# Patient Record
Sex: Male | Born: 1995 | Race: White | Hispanic: No | Marital: Single | Smoking: Current some day smoker
Health system: Southern US, Community
[De-identification: ages and names within clinical notes are randomized; demographics above are authoritative.]

## PROBLEM LIST (undated history)

## (undated) DIAGNOSIS — R011 Cardiac murmur, unspecified: Secondary | ICD-10-CM

## (undated) HISTORY — PX: HERNIA REPAIR: SHX51

---
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
## 2022-07-20 ENCOUNTER — Emergency Department (HOSPITAL_COMMUNITY): Payer: Self-pay

## 2022-07-20 ENCOUNTER — Emergency Department (HOSPITAL_COMMUNITY)
Admission: EM | Admit: 2022-07-20 | Discharge: 2022-07-21 | Discharge status: Against Medical Advice | Payer: Self-pay | Attending: Medical | Admitting: Medical

## 2022-07-20 ENCOUNTER — Encounter (HOSPITAL_COMMUNITY): Payer: Self-pay

## 2022-07-20 ENCOUNTER — Other Ambulatory Visit: Payer: Self-pay

## 2022-07-20 DIAGNOSIS — Z5321 Procedure and treatment not carried out due to patient leaving prior to being seen by health care provider: Secondary | ICD-10-CM | POA: Insufficient documentation

## 2022-07-20 DIAGNOSIS — J101 Influenza due to other identified influenza virus with other respiratory manifestations: Secondary | ICD-10-CM | POA: Insufficient documentation

## 2022-07-20 DIAGNOSIS — Z1152 Encounter for screening for COVID-19: Secondary | ICD-10-CM | POA: Insufficient documentation

## 2022-07-20 DIAGNOSIS — R519 Headache, unspecified: Secondary | ICD-10-CM | POA: Insufficient documentation

## 2022-07-20 DIAGNOSIS — R059 Cough, unspecified: Secondary | ICD-10-CM | POA: Insufficient documentation

## 2022-07-20 DIAGNOSIS — R0602 Shortness of breath: Secondary | ICD-10-CM | POA: Insufficient documentation

## 2022-07-20 DIAGNOSIS — R0981 Nasal congestion: Secondary | ICD-10-CM | POA: Insufficient documentation

## 2022-07-20 LAB — RESP PANEL BY RT-PCR (RSV, FLU A&B, COVID)  RVPGX2
Influenza A by PCR: POSITIVE — AB
Influenza B by PCR: NEGATIVE
Resp Syncytial Virus by PCR: NEGATIVE
SARS Coronavirus 2 by RT PCR: NEGATIVE

## 2022-07-20 MED ORDER — ACETAMINOPHEN 325 MG PO TABS
650.0000 mg | ORAL_TABLET | Freq: Once | ORAL | Status: AC
  Administered 2022-07-20: 650 mg via ORAL
  Filled 2022-07-20: qty 2

## 2022-07-20 NOTE — ED Triage Notes (Signed)
Pt arrived complaining of shortness of breath, cough, congestion and headache.  Pt state that he has been taking OTC meds with minimal relief

## 2022-07-20 NOTE — ED Provider Triage Note (Signed)
Emergency Medicine Provider Triage Evaluation Note  Tim Perez , a 25 y.o. male  was evaluated in triage.  Pt complains of SOB, cough, fatiguex1 week. Hurts to cough. No N/V/D. Endorses runny nose.  Review of Systems  Positive: Fatigue, chills Negative: fever  Physical Exam  BP (!) 141/98   Pulse (!) 104   Temp 98.3 F (36.8 C)   Resp 20   Ht 5\' 9"  (1.753 m)   Wt 61.2 kg   SpO2 96%   BMI 19.94 kg/m  Gen:   Awake, no distress   Resp:  Normal effort  MSK:   Moves extremities without difficulty  Other:  +coarse breath sounds   Medical Decision Making  Medically screening exam initiated at 8:47 PM.  Appropriate orders placed.  Hamid was informed that the remainder of the evaluation will be completed by another provider, this initial triage assessment does not replace that evaluation, and the importance of remaining in the ED until their evaluation is complete.    Jackie Plum, Pete Pelt 07/20/22 2048

## 2022-07-21 NOTE — ED Notes (Signed)
NA x3 vitals 

## 2022-07-26 ENCOUNTER — Emergency Department (HOSPITAL_COMMUNITY): Payer: Self-pay

## 2022-07-26 ENCOUNTER — Emergency Department (HOSPITAL_COMMUNITY)
Admission: EM | Admit: 2022-07-26 | Discharge: 2022-07-27 | Discharge status: Against Medical Advice | Payer: Self-pay | Attending: Student | Admitting: Student

## 2022-07-26 ENCOUNTER — Other Ambulatory Visit: Payer: Self-pay

## 2022-07-26 DIAGNOSIS — F10129 Alcohol abuse with intoxication, unspecified: Secondary | ICD-10-CM | POA: Insufficient documentation

## 2022-07-26 DIAGNOSIS — Z5321 Procedure and treatment not carried out due to patient leaving prior to being seen by health care provider: Secondary | ICD-10-CM | POA: Insufficient documentation

## 2022-07-26 NOTE — ED Provider Triage Note (Signed)
Emergency Medicine Provider Triage Evaluation Note  Tim Perez , a 27 y.o. male  was evaluated in triage.  Pt complains of " my sister thinks I need help".  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  Denies any other recreational drug use.  Endorses passive SI ongoing but states that he would never harm himself, has never had a plan to harm himself and wants to live for his daughter.  No HI or AVH.Marland Kitchen  Review of Systems  Positive: As above Negative: As above  Physical Exam  BP (!) 142/93 (BP Location: Left Arm)   Pulse (!) 105   Temp 98.8 F (37.1 C) (Oral)   Resp 20   Ht 5\' 9"  (1.753 m)   Wt 61.2 kg   SpO2 98%   BMI 19.94 kg/m  Gen:   Awake, no distress   Resp:  Normal effort  MSK:   Moves extremities without difficulty  Other:  RRR no m/r/g, smells strongly of ETOH  Medical Decision Making  Medically screening exam initiated at 11:29 PM.  Appropriate orders placed.  Tim Perez was informed that the remainder of the evaluation will be completed by another provider, this initial triage assessment does not replace that evaluation, and the importance of remaining in the ED until their evaluation is complete.  This chart was dictated using voice recognition software, Dragon. Despite the best efforts of this provider to proofread and correct errors, errors may still occur which can change documentation meaning.    Emeline Darling, PA-C 07/26/22 2337
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
## 2022-07-27 LAB — COMPREHENSIVE METABOLIC PANEL
ALT: 52 U/L — ABNORMAL HIGH (ref 0–44)
AST: 100 U/L — ABNORMAL HIGH (ref 15–41)
Albumin: 4.4 g/dL (ref 3.5–5.0)
Alkaline Phosphatase: 76 U/L (ref 38–126)
Anion gap: 15 (ref 5–15)
BUN: 6 mg/dL (ref 6–20)
CO2: 26 mmol/L (ref 22–32)
Calcium: 9.1 mg/dL (ref 8.9–10.3)
Chloride: 100 mmol/L (ref 98–111)
Creatinine, Ser: 0.85 mg/dL (ref 0.61–1.24)
GFR, Estimated: >60 mL/min (ref >60)
Glucose, Bld: 107 mg/dL — ABNORMAL HIGH (ref 70–99)
Potassium: 4.3 mmol/L (ref 3.5–5.1)
Sodium: 141 mmol/L (ref 135–145)
Total Bilirubin: 1.1 mg/dL (ref 0.3–1.2)
Total Protein: 7.4 g/dL (ref 6.5–8.1)

## 2022-07-27 LAB — CBC WITH DIFFERENTIAL/PLATELET
Abs Immature Granulocytes: 0.08 10*3/uL — ABNORMAL HIGH (ref 0.00–0.07)
Basophils Absolute: 0.1 10*3/uL (ref 0.0–0.1)
Basophils Relative: 1 %
Eosinophils Absolute: 0.1 10*3/uL (ref 0.0–0.5)
Eosinophils Relative: 1 %
HCT: 51.5 % (ref 39.0–52.0)
Hemoglobin: 17.6 g/dL — ABNORMAL HIGH (ref 13.0–17.0)
Immature Granulocytes: 1 %
Lymphocytes Relative: 39 %
Lymphs Abs: 3.8 10*3/uL (ref 0.7–4.0)
MCH: 34.4 pg — ABNORMAL HIGH (ref 26.0–34.0)
MCHC: 34.2 g/dL (ref 30.0–36.0)
MCV: 100.6 fL — ABNORMAL HIGH (ref 80.0–100.0)
Monocytes Absolute: 0.6 10*3/uL (ref 0.1–1.0)
Monocytes Relative: 6 %
Neutro Abs: 5 10*3/uL (ref 1.7–7.7)
Neutrophils Relative %: 52 %
Platelets: 247 10*3/uL (ref 150–400)
RBC: 5.12 MIL/uL (ref 4.22–5.81)
RDW: 12.8 % (ref 11.5–15.5)
WBC: 9.6 10*3/uL (ref 4.0–10.5)
nRBC: 0 % (ref 0.0–0.2)

## 2022-07-27 LAB — ACETAMINOPHEN LEVEL: Acetaminophen (Tylenol), Serum: <10 ug/mL — ABNORMAL LOW (ref 10–30)

## 2022-07-27 LAB — ETHANOL: Alcohol, Ethyl (B): 379 mg/dL (ref <10)

## 2022-07-27 LAB — TROPONIN I (HIGH SENSITIVITY): Troponin I (High Sensitivity): 6 ng/L (ref <18)

## 2022-07-27 LAB — SALICYLATE LEVEL: Salicylate Lvl: <7 mg/dL — ABNORMAL LOW (ref 7.0–30.0)

## 2022-07-27 NOTE — ED Triage Notes (Signed)
No answer for 2nd trop, was told he left

## 2022-07-27 NOTE — ED Notes (Signed)
NA x4 vitals 

## 2023-08-16 ENCOUNTER — Ambulatory Visit (HOSPITAL_COMMUNITY)
Admission: EM | Admit: 2023-08-16 | Discharge: 2023-08-16 | Disposition: A | Discharge status: Home / Self Care | Payer: No Payment, Other | Attending: Nurse Practitioner | Admitting: Nurse Practitioner

## 2023-08-16 DIAGNOSIS — F419 Anxiety disorder, unspecified: Secondary | ICD-10-CM | POA: Insufficient documentation

## 2023-08-16 DIAGNOSIS — F17290 Nicotine dependence, other tobacco product, uncomplicated: Secondary | ICD-10-CM | POA: Insufficient documentation

## 2023-08-16 DIAGNOSIS — F1090 Alcohol use, unspecified, uncomplicated: Secondary | ICD-10-CM

## 2023-08-16 DIAGNOSIS — F32A Depression, unspecified: Secondary | ICD-10-CM | POA: Insufficient documentation

## 2023-08-16 DIAGNOSIS — F1010 Alcohol abuse, uncomplicated: Secondary | ICD-10-CM | POA: Insufficient documentation
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
## 2023-08-16 NOTE — Discharge Instructions (Addendum)
  Discharge recommendations:  Please follow up with your primary care provider for all medical related needs.   Therapy: We recommend that patient participate in individual therapy to address mental health concerns.  Safety:  The patient should abstain from use of illicit substances/drugs and abuse of any medications. If symptoms worsen or do not continue to improve or if the patient becomes actively suicidal or homicidal then it is recommended that the patient return to the closest hospital emergency department, the [facility], or call 911 for further evaluation and treatment. National Suicide Prevention Lifeline 1-800-SUICIDE or 701-112-8624.  About 988 988 offers 24/7 access to trained crisis counselors who can help people experiencing mental health-related distress. People can call or text 988 or chat 988lifeline.org for themselves or if they are worried about a loved one who may need crisis support.  Crisis Mobile: [facility]:                     952-088-1991 (for crisis response 24 hours a day) [facility] Hotline:                                            613-651-1245

## 2023-08-16 NOTE — Progress Notes (Signed)
   08/16/23 2031  [facility] Triage Screening (Walk-ins at [facility] only)  How Did You Hear About [place]? Family/Friend  What Is the Reason for Your Visit/Call Today? Pt presents to [facility] as a voluntary walk-in, accompanied by his family requesting substance abuse treatment. Pt reports drinking daily for about 2 years. Pt reports drinking 2 beers within the last 24 hours, however he typically drinks about a 12-pack of beers a day. Pt reports history of depression and anxiety. Pt denies being established with psychiatrist and is not taking psychotropic medications at this time. Pt currently denies SI,HI,AVH.  How [place] Has This Been Causing You Problems? > than 6 months  Have You Recently Had Any Thoughts About Hurting Yourself? No  Are You Planning to Commit Suicide/Harm Yourself At This time? No  Have you Recently Had Thoughts About Hurting Someone Karolee Ohs? No  Are You Planning To Harm Someone At This Time? No  Physical Abuse Denies  Verbal Abuse Denies  Sexual Abuse Denies  Exploitation of patient/patient's resources Denies  Self-Neglect Denies  Are you currently experiencing any auditory, visual or other hallucinations? No  Have You Used Any Alcohol or Drugs in the Past 24 Hours? Yes  What Did You Use and How Much? Beer (2)  Do you have any current medical co-morbidities that require immediate attention? No  Clinician description of patient physical appearance/behavior: pt is cooperative, calm, casually dressed, oriented  What Do You Feel Would Help You the Most Today? Alcohol or Drug Use Treatment  If access to [facility] was not available, would you have sought care in the Emergency Department? No  Determination of Need Routine (7 days)  Options For Referral Other: Comment;Chemical Dependency Intensive Outpatient Therapy (CDIOP);Facility-Based Crisis

## 2023-08-16 NOTE — ED Provider Notes (Signed)
[facility] Medical Screening Exam  Patient Name: Tim Perez MRN: [id number] Date of Evaluation: 08/16/23 Chief Complaint:  "I'm here to get help for alcoholism".  Diagnosis:  Final diagnoses:  Alcohol abuse  Alcohol use disorder    History of Present illness: Tim SPRUIELL is a 28 y.o. male. With psychiatric history of anxiety, depression, and alcohol abuse, who presented voluntarily as a walk in to [facility] accompanied by his family,  and requesting outpatient resources/treatment for alcohol abuse.   Patient was seen face to face by this provider and chart reviewed with Dr Miguel Rota. Per chart review, patient last presented to an ED for assistance with alcohol abuse 07/29/22.  Patient denies a history of alcohol rehab treatment/stay.  On evaluation, patient is alert, oriented x 3, and cooperative. Speech is clear, normal rate and coherent. Pt appears casually dressed. Eye contact is good. Mood is anxious, affect is congruent with mood. Thought process is coherent/goal directed and thought content is WDL. Pt denies SI/HI/AVH or paranoia. There is no objective indication that the patient is responding to internal stimuli. No delusions elicited during this assessment.    On approach, patient is calm and cooperative.  No evidence of withdrawals symptoms present.  Patient denies same.  Patient reports. "I'm here for alcoholism, it's been ongoing off/on for almost 2 years, I drink at most 12 cans of beer a day, sometimes 6 packs, it depends, my last drink was 2 small cans of beer earlier today". Patient also endorses using a nicotine Vape daily.  He denies a history of alcohol withdrawals or DTs.  He also denies a history of seizures.  Patient reports he lives with his girlfriend and has family around home and are supportive.  He denies ownership or access to a gun.  He denies a history of suicide attempts or self-harm behaviors.  He also denies a history of inpatient  psychiatric hospitalization.  He endorses mild anxiety symptoms 3/10, with 0 being no anxiety and 10 being the worst anxiety.   He reports his anxiety has been present for a while, and "it led me to drinking because I went to get help at the time, but the medication I was given was not working, so I picked up drinking because I felt like having a drink helps me calm down".  Patient reports he is not currently established with an outpatient psychiatric provider for medication management or therapy and is not currently taking any medications. Patient describes his sleep as fair as he uses nightly an OTC store brand sleep aid. He describes his appetite as good.   Patient completed the PHQ-9 questionnaire and obtained a total score of 4, indicating minimal depression.  Patient is requesting resources for substance abuse intensive outpatient program and reports he would do better with family and his girlfriend around him instead of "being confined to a facility without his support system".  Patient is offered option for inpatient admission to the [facility] for alcohol detox treatment. Patient declines this offer and verbalizes his understanding of the risk of withdrawals including up to seizures. Patient is encouraged to return for admission to the [facility] for alcohol detox/treatment if he changes his mind.   Support, encouragement, reassurance provided about ongoing stressors.  Patient is provided with opportunity for questions.   Flowsheet Row ED from 08/16/2023 in [facility] ED from 07/26/2022 in [facility] Emergency Department at [facility] ED from 07/20/2022 in [facility] Emergency Department  at [facility]  C-SSRS RISK CATEGORY No Risk No Risk No Risk       Psychiatric Specialty Exam  Presentation  General Appearance:Casual  Eye Contact:Good  Speech:Clear and Coherent  Speech Volume:Normal  Handedness:Right   Mood and Affect   Mood: Anxious  Affect: Congruent   Thought Process  Thought Processes: Coherent; Goal Directed  Descriptions of Associations:Intact  Orientation:Full (Time, Place and Person)  Thought Content:WDL    Hallucinations:None  Ideas of Reference:None  Suicidal Thoughts:No  Homicidal Thoughts:No   Sensorium  Memory: Immediate Good  Judgment: Intact  Insight: Fair   Art therapist  Concentration: Good  Attention Span: Good  Recall: Good  Fund of Knowledge: Good  Language: Good   Psychomotor Activity  Psychomotor Activity: Normal   Assets  Assets: Communication Skills; Desire for Improvement; Social Support   Sleep  Sleep: Fair  Number of hours: No data recorded  Physical Exam: Physical Exam Constitutional:      General: He is not in acute distress.    Appearance: He is not diaphoretic.  HENT:     Head: Normocephalic.     Right Ear: External ear normal.     Left Ear: External ear normal.     Nose: No congestion.  Eyes:     General:        Right eye: No discharge.        Left eye: No discharge.  Pulmonary:     Effort: No respiratory distress.  Chest:     Chest wall: No tenderness.  Neurological:     Mental Status: He is alert and oriented to person, place, and time.  Psychiatric:        Attention and Perception: Attention and perception normal.        Mood and Affect: Mood is anxious.        Speech: Speech normal.        Behavior: Behavior is cooperative.        Thought Content: Thought content normal.        Cognition and Memory: Cognition and memory normal.    Review of Systems  Constitutional:  Negative for chills, diaphoresis and fever.  HENT:  Negative for congestion.   Eyes:  Negative for discharge.  Respiratory:  Negative for cough, shortness of breath and wheezing.   Cardiovascular:  Negative for chest pain and palpitations.  Gastrointestinal:  Negative for diarrhea, nausea and vomiting.  Neurological:   Negative for dizziness, seizures, loss of consciousness, weakness and headaches.  Psychiatric/Behavioral:  Positive for substance abuse. The patient is nervous/anxious.    Blood pressure (!) 146/72, pulse 88, temperature 97.7 F (36.5 C), temperature source Oral, resp. rate 18, SpO2 99%. There is no height or weight on file to calculate BMI.  Musculoskeletal: Strength & Muscle Tone: within normal limits Gait & Station: normal Patient leans: N/A   [facility] MSE Discharge Disposition for Follow up and Recommendations: Based on my evaluation the patient does not appear to have an emergency medical condition and can be discharged with resources and follow up care in outpatient services for Substance Abuse Intensive Outpatient Program  Recommend discharge home and follow-up with substance abuse intensive outpatient program.  Appropriate treatment resources provided.  Patient educated on risk of continued alcohol use.  He verbalizes his understanding and willingness for outpatient treatment.  Patient denies SI/HI/AVH or paranoia.  Patient does not meet IVC criteria at this time, there is no evidence of imminent risk of harm to self or  others.  Discharge recommendations:  Please follow up with your primary care provider for all medical related needs.   Therapy: We recommend that patient participate in individual therapy to address mental health concerns.  Safety:  The patient should abstain from use of illicit substances/drugs and abuse of any medications. If symptoms worsen or do not continue to improve or if the patient becomes actively suicidal or homicidal then it is recommended that the patient return to the closest hospital emergency department, the [facility], or call 911 for further evaluation and treatment. National Suicide Prevention Lifeline 1-800-SUICIDE or 506-401-4420.  About 988 988 offers 24/7 access to trained crisis counselors who can help people  experiencing mental health-related distress. People can call or text 988 or chat 988lifeline.org for themselves or if they are worried about a loved one who may need crisis support.  Crisis Mobile: [facility]:                     228-677-5558 (for crisis response 24 hours a day) [facility] Hotline:                                            (707)268-9193   Patient discharged home with family in stable condition.  Mancel Bale, NP 08/16/2023, 10:13 PM
